# Patient Record
Sex: Male | Born: 1985 | Race: White | Hispanic: No | Marital: Single | Smoking: Never smoker
Health system: Southern US, Community
[De-identification: ages and names within clinical notes are randomized; demographics above are authoritative.]

---

## 2017-08-23 ENCOUNTER — Encounter (HOSPITAL_BASED_OUTPATIENT_CLINIC_OR_DEPARTMENT_OTHER): Payer: Self-pay | Admitting: Emergency Medicine

## 2017-08-23 ENCOUNTER — Emergency Department (HOSPITAL_BASED_OUTPATIENT_CLINIC_OR_DEPARTMENT_OTHER)
Admission: EM | Admit: 2017-08-23 | Discharge: 2017-08-23 | Disposition: A | Discharge status: Home / Self Care | Payer: Self-pay | Attending: Emergency Medicine | Admitting: Emergency Medicine

## 2017-08-23 ENCOUNTER — Emergency Department (HOSPITAL_BASED_OUTPATIENT_CLINIC_OR_DEPARTMENT_OTHER): Payer: Self-pay

## 2017-08-23 ENCOUNTER — Other Ambulatory Visit: Payer: Self-pay

## 2017-08-23 DIAGNOSIS — Y9367 Activity, basketball: Secondary | ICD-10-CM | POA: Insufficient documentation

## 2017-08-23 DIAGNOSIS — X58XXXA Exposure to other specified factors, initial encounter: Secondary | ICD-10-CM | POA: Insufficient documentation

## 2017-08-23 DIAGNOSIS — Y929 Unspecified place or not applicable: Secondary | ICD-10-CM | POA: Insufficient documentation

## 2017-08-23 DIAGNOSIS — R600 Localized edema: Secondary | ICD-10-CM | POA: Insufficient documentation

## 2017-08-23 DIAGNOSIS — Y998 Other external cause status: Secondary | ICD-10-CM | POA: Insufficient documentation

## 2017-08-23 DIAGNOSIS — S93601A Unspecified sprain of right foot, initial encounter: Secondary | ICD-10-CM | POA: Insufficient documentation

## 2017-08-23 MED ORDER — IBUPROFEN 800 MG PO TABS: 800.0000 mg | ORAL_TABLET | Freq: Three times a day (TID) | ORAL | 0 refills | Status: AC | PRN

## 2017-08-23 NOTE — Discharge Instructions (Signed)

## 2017-08-23 NOTE — ED Triage Notes (Signed)
Patient states that he was playing basketball with his kids and fell onto his right foot- noted swelling and bruising to right foot

## 2017-08-23 NOTE — ED Provider Notes (Signed)
Emergency Department Provider Note   I have reviewed the triage vital signs and the nursing notes.   HISTORY  Chief Complaint Foot Pain   HPI Justin Braun is a 31 y.o. male presents to the emergency department for evaluation after injury to the right foot while playing basketball.  Patient states that he jumped and landed on his right foot on the concrete surface.  He did not roll his ankle.  Denies any fall to the ground or head trauma.  No loss of consciousness.  He immediately had pain and swelling to the right lateral foot.  Patient states the swelling has decreased slightly since arrival in the emergency department. Denies numbness/tingling.    History reviewed. No pertinent past medical history.  There are no active problems to display for this patient.   History reviewed. No pertinent surgical history.    Allergies Patient has no known allergies.  History reviewed. No pertinent family history.  Social History Social History   Tobacco Use  . Smoking status: Never Smoker  . Smokeless tobacco: Never Used  Substance Use Topics  . Alcohol use: No    Frequency: Never  . Drug use: No    Review of Systems  Constitutional: No fever/chills Eyes: No visual changes. ENT: No sore throat. Cardiovascular: Denies chest pain. Respiratory: Denies shortness of breath. Gastrointestinal: No abdominal pain.  No nausea, no vomiting.  No diarrhea.  No constipation. Genitourinary: Negative for dysuria. Musculoskeletal: Negative for back pain. Positive right lateral foot swelling and pain.  Skin: Negative for rash. Neurological: Negative for headaches, focal weakness or numbness.  10-point ROS otherwise negative.  ____________________________________________   PHYSICAL EXAM:  VITAL SIGNS: ED Triage Vitals  Enc Vitals Group     BP 08/23/17 1708 117/67     Pulse Rate 08/23/17 1708 78     Resp 08/23/17 1708 18     Temp 08/23/17 1708 98.5 F (36.9 C)     Temp  Source 08/23/17 1708 Oral     SpO2 08/23/17 1708 100 %     Weight 08/23/17 1708 170 lb (77.1 kg)     Height 08/23/17 1708 6\' 2"  (1.88 m)     Pain Score 08/23/17 1707 8   Constitutional: Alert and oriented. Well appearing and in no acute distress. Eyes: Conjunctivae are normal.  Head: Atraumatic. Nose: No congestion/rhinnorhea. Mouth/Throat: Mucous membranes are moist.  Neck: No stridor. Cardiovascular: Normal rate, regular rhythm. Good peripheral circulation. Grossly normal heart sounds.   Respiratory: Normal respiratory effort.  No retractions. Lungs CTAB. Gastrointestinal: Soft and nontender. No distention.  Musculoskeletal: No lower extremity tenderness nor edema. No gross deformities of extremities. Positive right lateral foot swelling and ecchymosis. No midfoot tenderness. No ankle tenderness or edema.  Neurologic:  Normal speech and language. No gross focal neurologic deficits are appreciated.  Skin:  Skin is warm, dry and intact. No rash noted.  ____________________________________________  RADIOLOGY  Dg Foot Complete Right  Result Date: 08/23/2017 CLINICAL DATA:  Twisting injury playing basketball.  Lateral pain. EXAM: RIGHT FOOT COMPLETE - 3+ VIEW COMPARISON:  None. FINDINGS: There is no evidence of fracture or dislocation. There is no evidence of arthropathy or other focal bone abnormality. Soft tissues are unremarkable. IMPRESSION: Negative. Electronically Signed   By: Charlett NoseKevin  Dover M.D.   On: 08/23/2017 17:22    ____________________________________________   PROCEDURES  Procedure(s) performed:   Procedures  None ____________________________________________   INITIAL IMPRESSION / ASSESSMENT AND PLAN / ED COURSE  Pertinent labs &  imaging results that were available during my care of the patient were reviewed by me and considered in my medical decision making (see chart for details).  Patient with mild pain, swelling, ecchymosis over the right lateral foot.  No  tenderness to palpation of the ankle or proximal fibula.  Suspect sprain.  I reviewed the plain film images of the foot and do not visualize any fracture or dislocation.  The patient has no midfoot tenderness to palpation on my exam.  Plan for weightbearing as tolerated, crutches provided, postop shoe provided, and plan for RICE. Will follow up with sports med if symptoms worsen.   Crutches and post-op shoe provided. Provided contact info for Podiatry if symptoms worsen. NSAIDS and RICE is the plan at home.  ____________________________________________  FINAL CLINICAL IMPRESSION(S) / ED DIAGNOSES  Final diagnoses:  Sprain of right foot, initial encounter     NEW OUTPATIENT MEDICATIONS STARTED DURING THIS VISIT:  Motrin 800 mg    Note:  This document was prepared using Dragon voice recognition software and may include unintentional dictation errors.  Alona BeneJoshua Banessa Mao, MD Emergency Medicine    Berdia Lachman, Justin RepressJoshua G, MD 08/23/17 2002

## 2019-01-03 IMAGING — CR DG FOOT COMPLETE 3+V*R*
3 series · 3 of 3 positions shown · non-contrast
Comparison: None.

CLINICAL DATA: Twisting injury playing basketball.  Lateral pain.

EXAM:
RIGHT FOOT COMPLETE - 3+ VIEW

[t foot ap right]
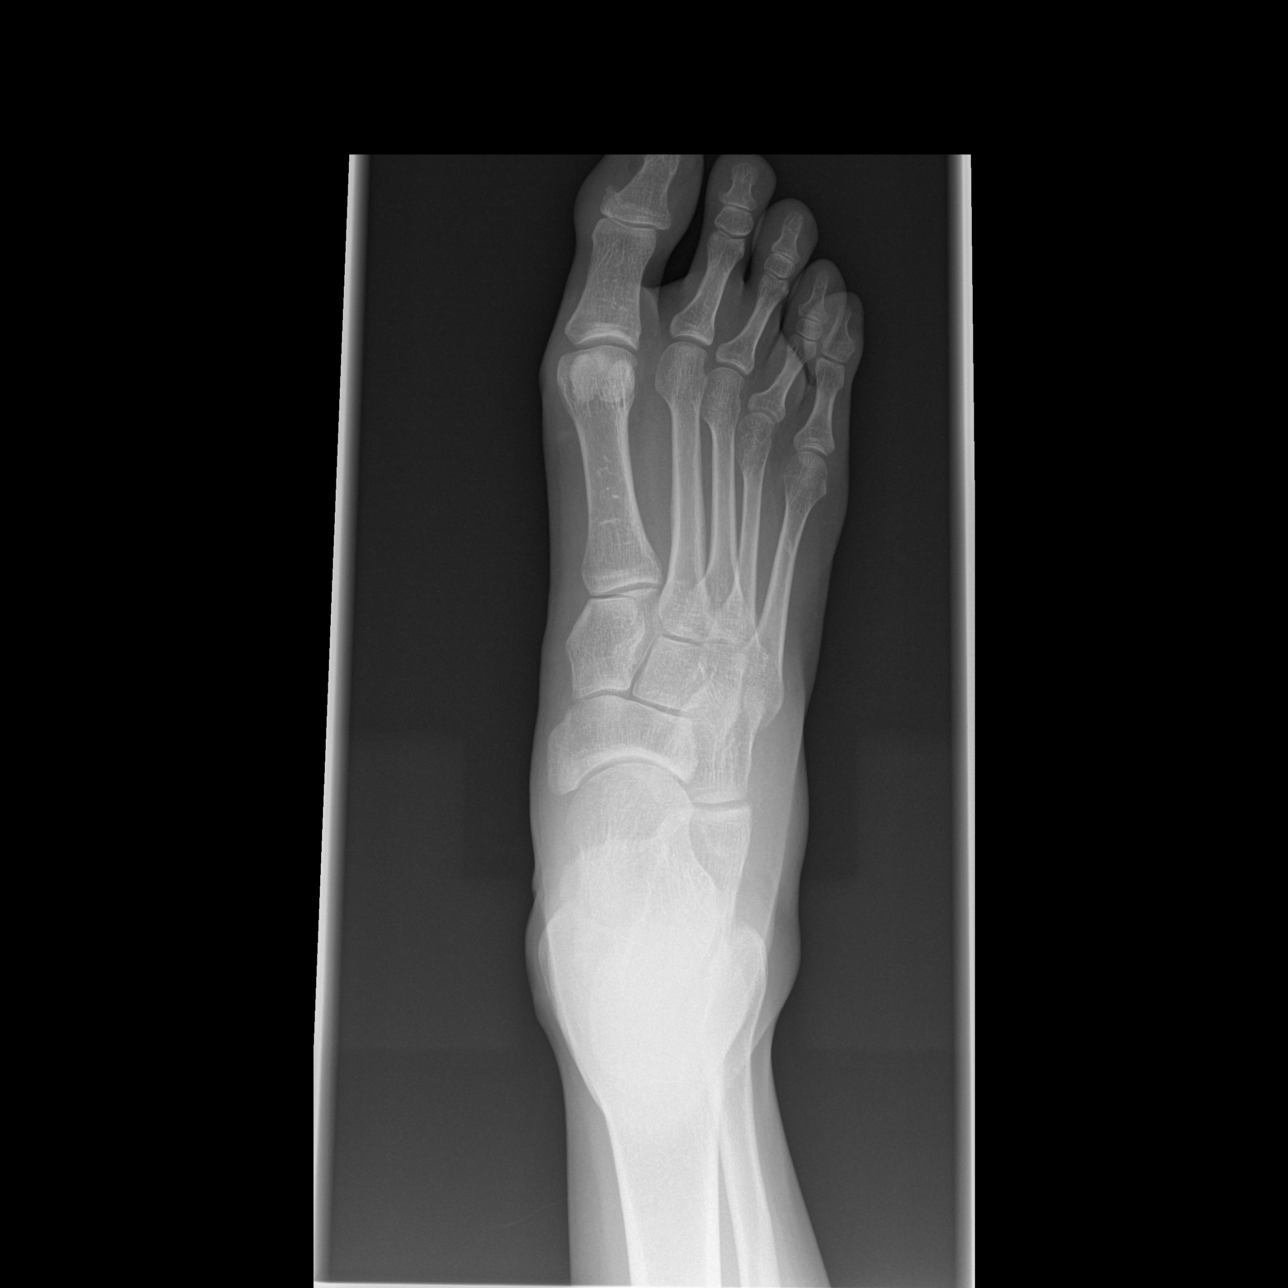

[t foot oblique right]
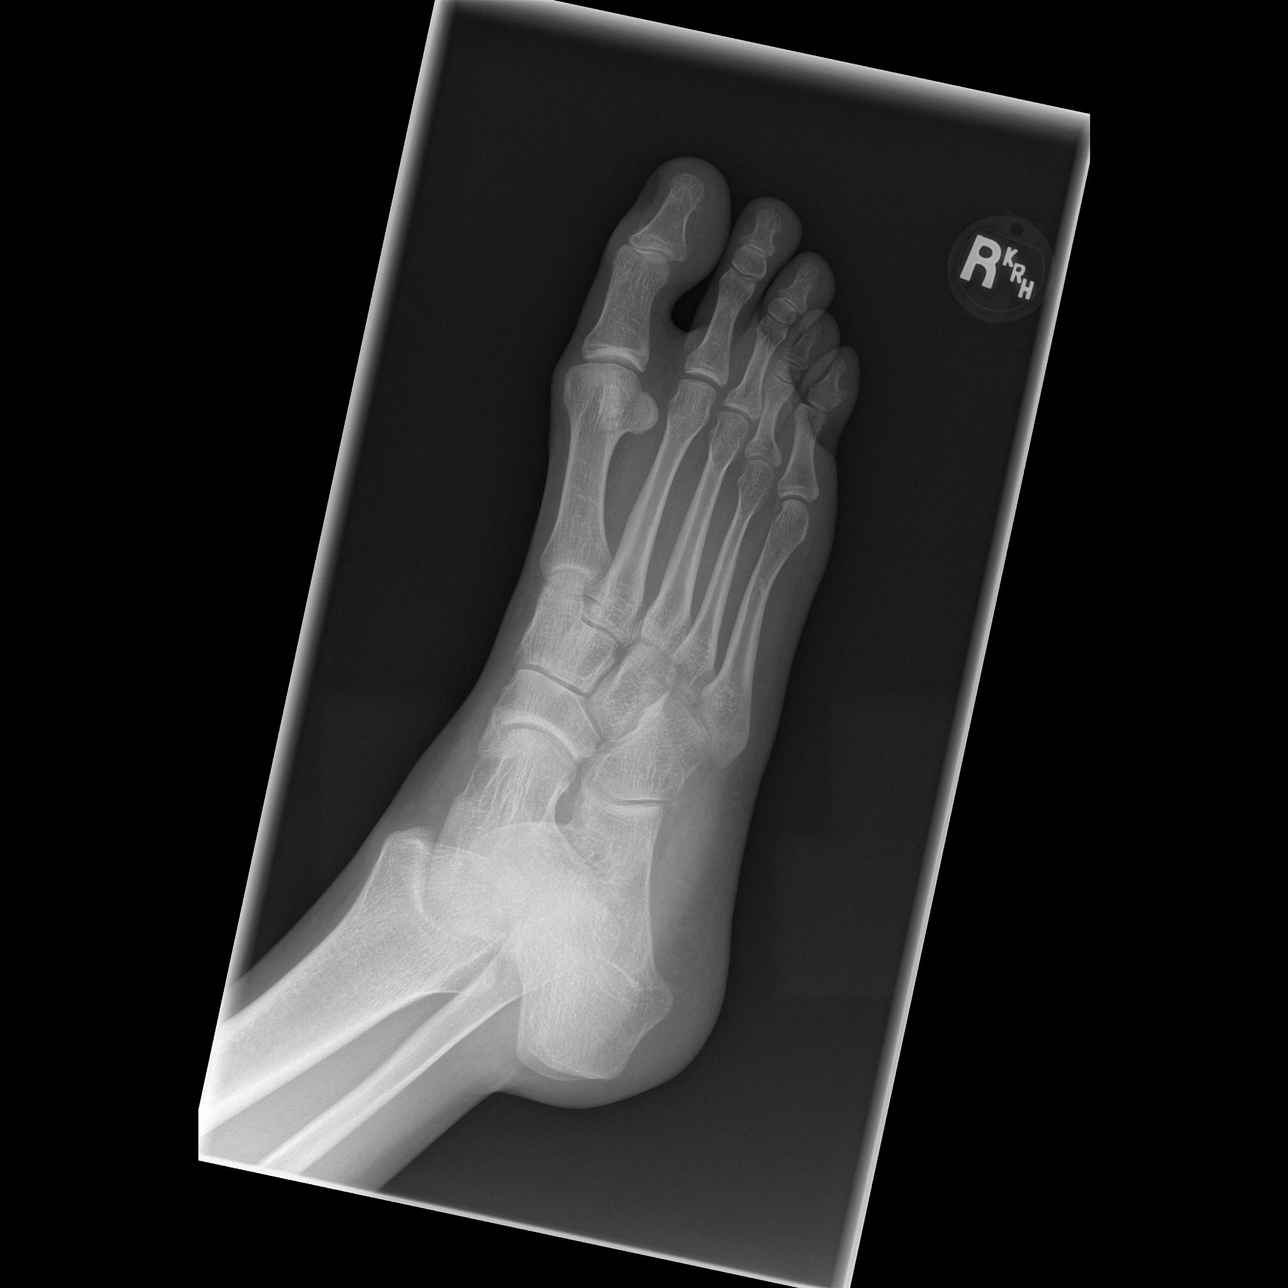

[t foot lat right]
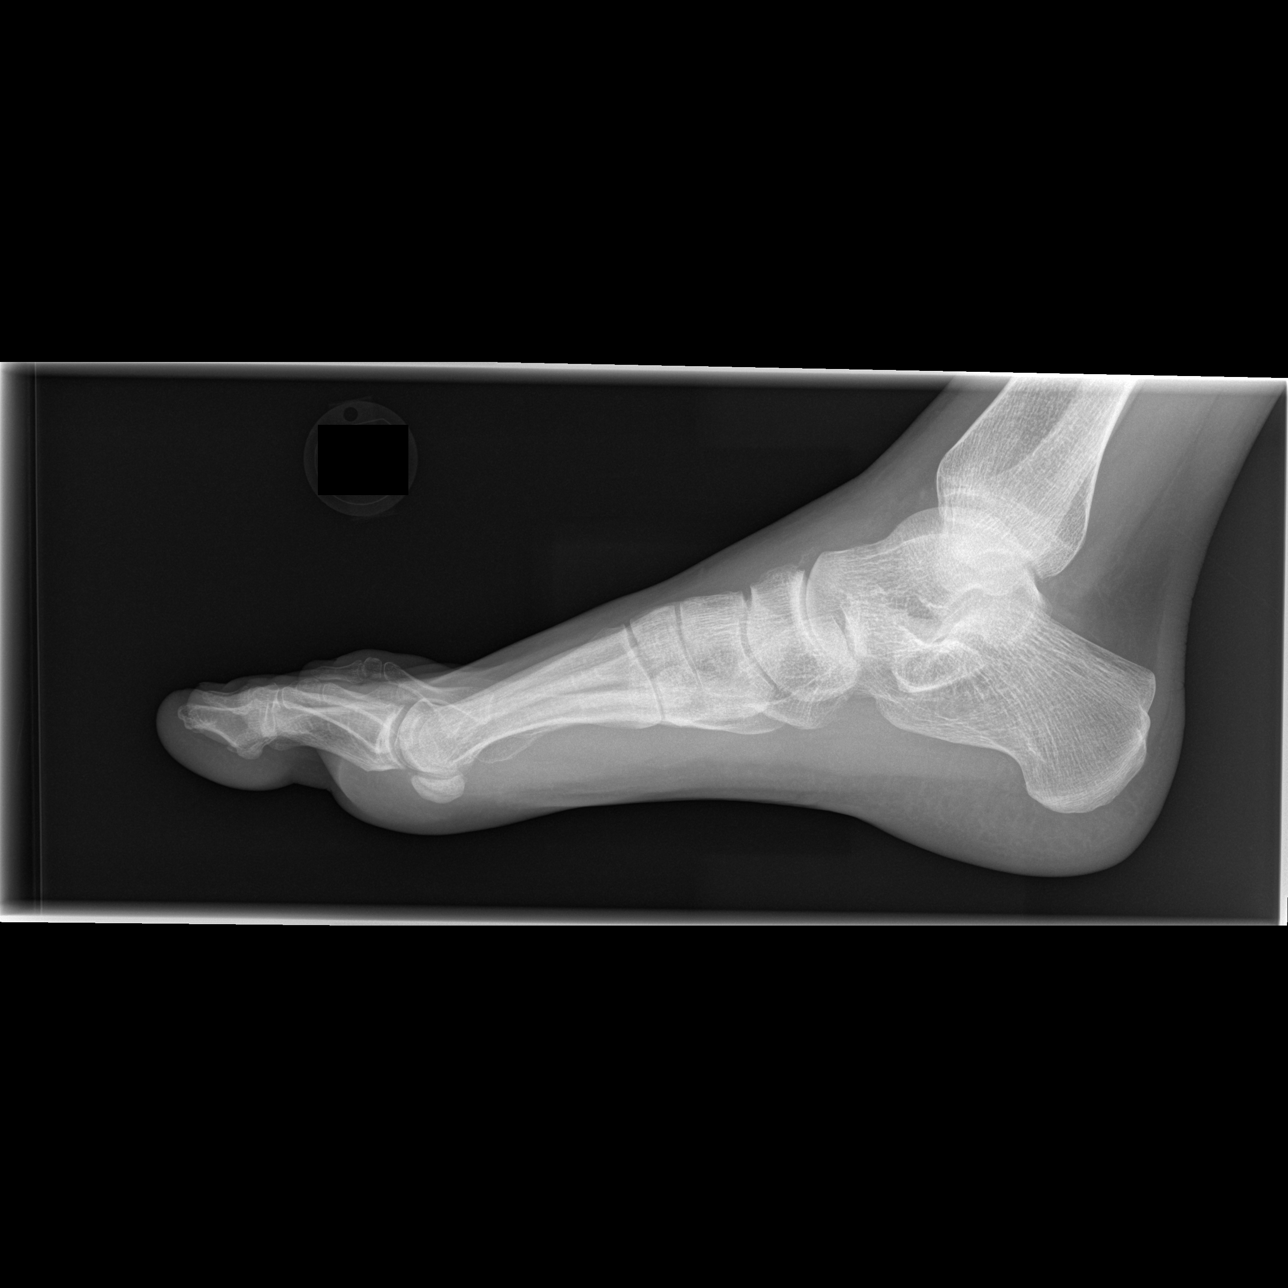

[3 of 3 positions shown; findings below may reference images not displayed]

FINDINGS: There is no evidence of fracture or dislocation. There is no
evidence of arthropathy or other focal bone abnormality. Soft
tissues are unremarkable.
IMPRESSION: Negative.
